# Patient Record
Sex: Female | Born: 1995 | Race: Black or African American | Hispanic: No | Marital: Single | Smoking: Current some day smoker
Health system: Southern US, Community
[De-identification: ages and names within clinical notes are randomized; demographics above are authoritative.]

## PROBLEM LIST (undated history)

## (undated) DIAGNOSIS — J302 Other seasonal allergic rhinitis: Secondary | ICD-10-CM

## (undated) DIAGNOSIS — J45909 Unspecified asthma, uncomplicated: Secondary | ICD-10-CM

## (undated) HISTORY — PX: NO PAST SURGERIES: SHX2092

---
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
## 2017-09-08 ENCOUNTER — Inpatient Hospital Stay (HOSPITAL_COMMUNITY)
Admission: AD | Admit: 2017-09-08 | Discharge: 2017-09-08 | Disposition: A | Discharge status: Home / Self Care | Payer: Medicaid Other | Source: Ambulatory Visit | Attending: Obstetrics & Gynecology | Admitting: Obstetrics & Gynecology

## 2017-09-08 DIAGNOSIS — Z3202 Encounter for pregnancy test, result negative: Secondary | ICD-10-CM | POA: Diagnosis not present

## 2017-09-08 DIAGNOSIS — R109 Unspecified abdominal pain: Secondary | ICD-10-CM | POA: Diagnosis present

## 2017-09-08 DIAGNOSIS — B373 Candidiasis of vulva and vagina: Secondary | ICD-10-CM | POA: Diagnosis not present

## 2017-09-08 DIAGNOSIS — B3731 Acute candidiasis of vulva and vagina: Secondary | ICD-10-CM

## 2017-09-08 DIAGNOSIS — F17290 Nicotine dependence, other tobacco product, uncomplicated: Secondary | ICD-10-CM | POA: Insufficient documentation

## 2017-09-08 LAB — POCT PREGNANCY, URINE: Preg Test, Ur: NEGATIVE

## 2017-09-08 LAB — URINALYSIS, ROUTINE W REFLEX MICROSCOPIC
BILIRUBIN URINE: NEGATIVE
Glucose, UA: NEGATIVE mg/dL
Hgb urine dipstick: NEGATIVE
KETONES UR: NEGATIVE mg/dL
Leukocytes, UA: NEGATIVE
NITRITE: NEGATIVE
Protein, ur: NEGATIVE mg/dL
SPECIFIC GRAVITY, URINE: 1.01 (ref 1.005–1.030)
pH: 6 (ref 5.0–8.0)

## 2017-09-08 LAB — WET PREP, GENITAL
Clue Cells Wet Prep HPF POC: NONE SEEN
Sperm: NONE SEEN
TRICH WET PREP: NONE SEEN
YEAST WET PREP: NONE SEEN

## 2017-09-08 MED ORDER — FLUCONAZOLE 150 MG PO TABS: 150.0000 mg | ORAL_TABLET | Freq: Once | ORAL | 0 refills | Status: AC

## 2017-09-08 NOTE — MAU Provider Note (Signed)
History     CSN: [id number]  Arrival date and time: 09/08/17 2050   First Provider Initiated Contact with Patient 09/08/17 2111      Chief Complaint  Patient presents with  . Abdominal Pain  . Vaginal Discharge   HPI Robin Cobb is a 22 y.o. G0P0000 non pregnant female who presents with burning with urination and a thick white discharge. She states these symptoms have been ongoing for the last 3 weeks and come and go. She reports intermittent abdominal pain as well that she rates a 4/10 and tries tylenol for with relief.   OB History    Gravida  0   Para  0   Term  0   Preterm  0   AB  0   Living  0     SAB  0   TAB  0   Ectopic  0   Multiple  0   Live Births  0           Past Medical History:  Diagnosis Date  . Asthma   . Seasonal allergies     Past Surgical History:  Procedure Laterality Date  . NO PAST SURGERIES      No family history on file.  Social History   Tobacco Use  . Smoking status: Current Some Day Smoker    Types: Cigars  . Smokeless tobacco: Never Used  Substance Use Topics  . Alcohol use: No  . Drug use: Yes    Types: Marijuana    Allergies: No Known Allergies  Medications Prior to Admission  Medication Sig Dispense Refill Last Dose  . albuterol (PROVENTIL HFA;VENTOLIN HFA) 108 (90 BASE) MCG/ACT inhaler Inhale 2 puffs into the lungs every 6 (six) hours as needed for wheezing.   Past Month at Unknown time  . nystatin cream (MYCOSTATIN) Apply 1 application topically 2 (two) times daily. 30 g 0   . phenazopyridine (PYRIDIUM) 100 MG tablet Take 1 tablet (100 mg total) by mouth 3 (three) times daily as needed for pain. 6 tablet 0   . triamcinolone cream (KENALOG) 0.1 % Apply 1 application topically 2 (two) times daily. 30 g 0     Review of Systems  Constitutional: Negative.  Negative for fatigue and fever.  HENT: Negative.   Respiratory: Negative.  Negative for shortness of breath.   Cardiovascular: Negative.   Negative for chest pain.  Gastrointestinal: Positive for abdominal pain. Negative for constipation, diarrhea, nausea and vomiting.  Genitourinary: Positive for dysuria and vaginal discharge.  Neurological: Negative.  Negative for dizziness and headaches.   Physical Exam   Blood pressure 137/80, pulse 76, temperature 97.8 F (36.6 C), temperature source Oral, resp. rate 16, height  (1.549 m), weight 166 lb 1.9 oz (75.4 kg), SpO2 100 %.  Physical Exam  Nursing note and vitals reviewed. Constitutional: She is oriented to person, place, and time. She appears well-developed and well-nourished. No distress.  HENT:  Head: Normocephalic.  Eyes: Pupils are equal, round, and reactive to light.  Cardiovascular: Normal rate, regular rhythm and normal heart sounds.  Respiratory: Effort normal and breath sounds normal. No respiratory distress.  GI: Soft. Bowel sounds are normal. She exhibits no distension. There is no tenderness.  Genitourinary: Vaginal discharge found.  Neurological: She is alert and oriented to person, place, and time.  Skin: Skin is warm and dry.  Psychiatric: She has a normal mood and affect. Her behavior is normal. Judgment and thought content normal.    MAU  Course  Procedures Results for orders placed or performed during the hospital encounter of 09/08/17 (from the past 24 hour(s))  Urinalysis, Routine w reflex microscopic     Status: None   Collection Time: 09/08/17  9:04 PM  Result Value Ref Range   Color, Urine YELLOW YELLOW   APPearance CLEAR CLEAR   Specific Gravity, Urine 1.010 1.005 - 1.030   pH 6.0 5.0 - 8.0   Glucose, UA NEGATIVE NEGATIVE mg/dL   Hgb urine dipstick NEGATIVE NEGATIVE   Bilirubin Urine NEGATIVE NEGATIVE   Ketones, ur NEGATIVE NEGATIVE mg/dL   Protein, ur NEGATIVE NEGATIVE mg/dL   Nitrite NEGATIVE NEGATIVE   Leukocytes, UA NEGATIVE NEGATIVE  Pregnancy, urine POC     Status: None   Collection Time: 09/08/17  9:12 PM  Result Value Ref  Range   Preg Test, Ur NEGATIVE NEGATIVE  Wet prep, genital     Status: Abnormal   Collection Time: 09/08/17  9:23 PM  Result Value Ref Range   Yeast Wet Prep HPF POC NONE SEEN NONE SEEN   Trich, Wet Prep NONE SEEN NONE SEEN   Clue Cells Wet Prep HPF POC NONE SEEN NONE SEEN   WBC, Wet Prep HPF POC FEW (A) NONE SEEN   Sperm NONE SEEN    MDM UA, UPT Wet prep and gc/chlamydia Patient denies any pain or burning with urination while in MAU  Negative wet prep but will treat for yeast due to clinical presentation.  Assessment and Plan   1. Candidiasis of vagina    -Discharge home in stable condition -Rx for diflucan given to patient -Patient advised to follow-up with gyn of choice for routine gyn concerns -Patient may return to MAU as needed or if her condition were to change or worsen  Rolm Bookbinder CNM 09/08/2017, 9:12 PM

## 2017-09-08 NOTE — MAU Note (Signed)
Burning w/urination, thick discharge and lower abdominal pain for 2 weeks. Denies bleeding.

## 2017-09-08 NOTE — Discharge Instructions (Signed)
In late 2019, the [facility] will be moving to the [facility] campus. At that time, the MAU (Maternity Admissions Unit), where you are being seen today, will no longer take care of non-pregnant patients. We strongly encourage you to find a doctor's office before that time, so that you can be seen with any GYN concerns, like vaginal discharge, urinary tract infection, etc.. in a timely manner.  In order to make an office visit more convenient, the Center for [facility] at [facility] will be offering evening hours with same-day appointments, walk-in appointments and scheduled appointments available during this time.  Center for [facility] @ [facility] Hours: Monday - 8am - 7:30 pm with walk-in between 4pm- 7:30 pm Tuesday - 8 am - 5 pm (starting 08/06/17 we will be open late and accepting walk-ins from 4pm - 7:30pm) Wednesday - 8 am - 5 pm (starting 11/06/17 we will be open late and accepting walk-ins from 4pm - 7:30pm) Thursday 8 am - 5 pm (starting 02/06/18 we will be open late and accepting walk-ins from 4pm - 7:30pm) Friday 8 am - 5 pm  For an appointment please call the Center for [facility] @ [facility] at [phone]  For urgent needs, [facility] is also available for management of urgent GYN complaints such as vaginal discharge or urinary tract infections.       Vaginal Yeast infection, Adult Vaginal yeast infection is a condition that causes soreness, swelling, and redness (inflammation) of the vagina. It also causes vaginal discharge. This is a common condition. Some women get this infection frequently. What are the causes? This condition is caused by a change in the normal balance of the yeast (candida) and bacteria that live in the vagina. This change causes an overgrowth of yeast, which causes the inflammation. What increases the risk? This condition is more likely to develop in:  Women who take antibiotic  medicines.  Women who have diabetes.  Women who take birth control pills.  Women who are pregnant.  Women who douche often.  Women who have a weak defense (immune) system.  Women who have been taking steroid medicines for a long time.  Women who frequently wear tight clothing.  What are the signs or symptoms? Symptoms of this condition include:  White, thick vaginal discharge.  Swelling, itching, redness, and irritation of the vagina. The lips of the vagina (vulva) may be affected as well.  Pain or a burning feeling while urinating.  Pain during sex.  How is this diagnosed? This condition is diagnosed with a medical history and physical exam. This will include a pelvic exam. Your health care provider will examine a sample of your vaginal discharge under a microscope. Your health care provider may send this sample for testing to confirm the diagnosis. How is this treated? This condition is treated with medicine. Medicines may be over-the-counter or prescription. You may be told to use one or more of the following:  Medicine that is taken orally.  Medicine that is applied as a cream.  Medicine that is inserted directly into the vagina (suppository).  Follow these instructions at home:  Take or apply over-the-counter and prescription medicines only as told by your health care provider.  Do not have sex until your health care provider has approved. Tell your sex partner that you have a yeast infection. That person should go to his or her health care provider if he or she develops symptoms.  Do not wear tight  clothes, such as pantyhose or tight pants.  Avoid using tampons until your health care provider approves.  Eat more yogurt. This may help to keep your yeast infection from returning.  Try taking a sitz bath to help with discomfort. This is a warm water bath that is taken while you are sitting down. The water should only come up to your hips and should cover your  buttocks. Do this 3-4 times per day or as told by your health care provider.  Do not douche.  Wear breathable, cotton underwear.  If you have diabetes, keep your blood sugar levels under control. Contact a health care provider if:  You have a fever.  Your symptoms go away and then return.  Your symptoms do not get better with treatment.  Your symptoms get worse.  You have new symptoms.  You develop blisters in or around your vagina.  You have blood coming from your vagina and it is not your menstrual period.  You develop pain in your abdomen. This information is not intended to replace advice given to you by your health care provider. Make sure you discuss any questions you have with your health care provider. Document Released: 01/31/2005 Document Revised: 10/05/2015 Document Reviewed: 10/25/2014 Elsevier Interactive Patient Education  2018 [employer]

## 2017-09-09 LAB — GC/CHLAMYDIA PROBE AMP (~~LOC~~) NOT AT ARMC
CHLAMYDIA, DNA PROBE: NEGATIVE
Neisseria Gonorrhea: POSITIVE — AB

## 2017-09-10 ENCOUNTER — Inpatient Hospital Stay (HOSPITAL_COMMUNITY)
Admission: AD | Admit: 2017-09-10 | Discharge: 2017-09-11 | Disposition: A | Discharge status: Home / Self Care | Payer: Medicaid Other | Source: Ambulatory Visit | Attending: Family Medicine | Admitting: Family Medicine

## 2017-09-10 DIAGNOSIS — F17290 Nicotine dependence, other tobacco product, uncomplicated: Secondary | ICD-10-CM | POA: Diagnosis not present

## 2017-09-10 DIAGNOSIS — A549 Gonococcal infection, unspecified: Secondary | ICD-10-CM

## 2017-09-10 MED ORDER — AZITHROMYCIN 250 MG PO TABS
1000.0000 mg | ORAL_TABLET | Freq: Once | ORAL | Status: AC
  Administered 2017-09-10: 1000 mg via ORAL
  Filled 2017-09-10: qty 4

## 2017-09-10 MED ORDER — CEFTRIAXONE SODIUM 250 MG IJ SOLR
250.0000 mg | Freq: Once | INTRAMUSCULAR | Status: AC
  Administered 2017-09-10: 250 mg via INTRAMUSCULAR
  Filled 2017-09-10: qty 250

## 2017-09-10 NOTE — MAU Note (Signed)
PT SAYS SHE SAW HER RESULTS ON MY CHART -  SAYS SHE WAS POSITIVE  FOR GC.     SHE WAS HERE  ON Sunday  - TESTED

## 2017-09-10 NOTE — MAU Provider Note (Signed)
Chief Complaint: Treatment Planning  SUBJECTIVE HPI: Robin Cobb is a 22 y.o. G0P0000 not currently pregnant who presents to maternity admissions for Gonorrhea treatment. She was seen on 5/5 in MAU- reports getting a notification from MyChart of positive results. She reports continued vaginal discharge that was present when seen on 5/5, she denies abdominal pain.   Past Medical History:  Diagnosis Date  . Asthma   . Seasonal allergies    Past Surgical History:  Procedure Laterality Date  . NO PAST SURGERIES     Social History   Socioeconomic History  . Marital status: Single    Spouse name: Not on file  . Number of children: Not on file  . Years of education: Not on file  . Highest education level: Not on file  Occupational History  . Not on file  Social Needs  . Financial resource strain: Not on file  . Food insecurity:    Worry: Not on file    Inability: Not on file  . Transportation needs:    Medical: Not on file    Non-medical: Not on file  Tobacco Use  . Smoking status: Current Some Day Smoker    Types: Cigars  . Smokeless tobacco: Never Used  Substance and Sexual Activity  . Alcohol use: No  . Drug use: Yes    Types: Marijuana  . Sexual activity: Yes    Birth control/protection: Condom  Lifestyle  . Physical activity:    Days per week: Not on file    Minutes per session: Not on file  . Stress: Not on file  Relationships  . Social connections:    Talks on phone: Not on file    Gets together: Not on file    Attends religious service: Not on file    Active member of club or organization: Not on file    Attends meetings of clubs or organizations: Not on file    Relationship status: Not on file  . Intimate partner violence:    Fear of current or ex partner: Not on file    Emotionally abused: Not on file    Physically abused: Not on file    Forced sexual activity: Not on file  Other Topics Concern  . Not on file  Social History Narrative   ** Merged  History Encounter **       No current facility-administered medications on file prior to encounter.    Current Outpatient Medications on File Prior to Encounter  Medication Sig Dispense Refill  . albuterol (PROVENTIL HFA;VENTOLIN HFA) 108 (90 BASE) MCG/ACT inhaler Inhale 2 puffs into the lungs every 6 (six) hours as needed for wheezing.     No Known Allergies  ROS:  Review of Systems  Respiratory: Negative.   Cardiovascular: Negative.   Gastrointestinal: Positive for abdominal pain.       No abdominal pain currently   Genitourinary: Positive for vaginal discharge.   I have reviewed patient's Past Medical Hx, Surgical Hx, Family Hx, Social Hx, medications and allergies.   Physical Exam   Patient Vitals for the past 24 hrs:  BP Temp Temp src Pulse Resp  09/10/17 2325 131/74 98 F (36.7 C) Oral 67 20   Constitutional: Well-developed, well-nourished female in no acute distress.  Cardiovascular: normal rate Respiratory: normal effort  LAB RESULTS Positive Gonorrhea on 09/08/2017, negative chlamydia.      MAU Management/MDM:  Meds ordered this encounter  Medications  . AND Linked Order Group   . cefTRIAXone (ROCEPHIN)  injection 250 mg     Order Specific Question:   Antibiotic Indication:     Answer:   STD   . azithromycin (ZITHROMAX) tablet 1,000 mg   Expedited partner therapy prescription unable to be given to patient- patient reports not knowing partner last name and DOB. Discussed importance of notifying partner of positive result and getting treatment at health department ASAP.   Pt discharged with strict STD precautions.  ASSESSMENT 1. Gonorrhea     PLAN Discharge home Follow up with Health department or PCP with concern about other STDs and treatment  Partner information sheet given to patient with need to present to Health Department for treatment   Follow-up Information    Department, [facility] Follow up.   Contact information: [address] [place] [address] [phone]           Allergies as of 09/10/2017   No Known Allergies     Medication List    TAKE these medications   albuterol 108 (90 Base) MCG/ACT inhaler Commonly known as:  PROVENTIL HFA;VENTOLIN HFA Inhale 2 puffs into the lungs every 6 (six) hours as needed for wheezing.      Steward Drone  Certified Nurse-Midwife 09/10/2017  11:59 PM

## 2017-09-10 NOTE — Discharge Instructions (Signed)
Expedited Partner Therapy:  °Information Sheet for Patients and Partners  °            ° °You have been offered expedited partner therapy (EPT). This information sheet contains important information and warnings you need to be aware of, so please read it carefully.  ° °Expedited Partner Therapy (EPT) is the clinical practice of treating the sexual partners of persons who receive chlamydia, gonorrhea, or trichomoniasis diagnoses by providing medications or prescriptions to the patient. Patients then provide partners with these therapies without the health-care provider having examined the partner. In other words, EPT is a convenient, fast and private way for patients to help their sexual partners get treated.  ° °Chlamydia and gonorrhea are bacterial infections you get from having sex with a person who is already infected. Trichomoniasis (or “trich”) is a very common sexually transmitted infection (STI) that is caused by infection with a protozoan parasite called Trichomonas vaginalis.  Many people with these infections don’t know it because they feel fine, but without treatment these infections can cause serious health problems, such as pelvic inflammatory disease, ectopic pregnancy, infertility and increased risk of HIV.  ° °It is important to get treated as soon as possible to protect your health, to avoid spreading these infections to others, and to prevent yourself from becoming re-infected. The good news is these infections can be easily cured with proper antibiotic medicine. The best way to take care of your self is to see a doctor or go to your local health department. If you are not able to see a doctor or other medical provider, you should take EPT.  ° ° °Recommended Medication: °EPT for Chlamydia:  Azithromycin (Zithromax) 1 gram orally in a single dose °EPT for Gonorrhea:  Cefixime (Suprax) 400 milligrams orally in a single dose PLUS azithromycin (Zithromax) 1 gram orally in a single dose °EPT for  Trichomoniasis:  Metronidazole (Flagyl) 2 grams orally in a single dose ° ° °These medicines are very safe. However, you should not take them if you have ever had an allergic reaction (like a rash) to any of these medicines: azithromycin (Zithromax), erythromycin, clarithromycin (Biaxin), metronidazole (Flagyl), tinidazole (Tindimax). If you are uncertain about whether you have an allergy, call your medical provider or pharmacist before taking this medicine. If you have a serious, long-term illness like kidney, liver or heart disease, colitis or stomach problems, or you are currently taking other prescription medication, talk to your provider before taking this medication.  ° °Women: If you have lower belly pain, pain during sex, vomiting, or a fever, do not take this medicine. Instead, you should see a medical provider to be certain you do not have pelvic inflammatory disease (PID). PID can be serious and lead to infertility, pregnancy problems or chronic pelvic pain.  ° °Pregnant Women: It is very important for you to see a doctor to get pregnancy services and pre-natal care. These antibiotics for EPT are safe for pregnant women, but you still need to see a medical provider as soon as possible. It is also important to note that Doxycycline is an alternative therapy for chlamydia, but it should not be taken by someone who is pregnant.  ° °Men: If you have pain or swelling in the testicles or a fever, do not take this medicine and see a medical provider.    ° °Men who have sex with men (MSM): MSM in [place] continue to experience high rates of syphilis and HIV. Many MSM with gonorrhea or   chlamydia could also have syphilis and/or HIV and not know it. If you are a man who has sex with other men, it is very important that you see a medical provider and are tested for HIV and syphilis. EPT is not recommended for gonorrhea for MSM.  Recommended treatment for gonorrhea for MSM is Rocephin (shot) AND azithromycin  due to decreased cure rate.  Please see your medical provider if this is the case.   ° °Along with this information sheet is a prescription for the medicine. If you receive a prescription it will be in your name and will indicate your date of birth, or it will be in the name of “Expedited Partner Therapy”.   In either case, you can have the prescription filled at a pharmacy. You will be responsible for the cost of the medicine, unless you have prescription drug coverage. In that case, you could provide your name so the pharmacy could bill your health plan.  ° °Take the medication as directed. Some people will have a mild, upset stomach, which does not last long. AVOID alcohol 24 hours after taking metronidazole (Flagyl) to reduce the possibility of a disulfiram-like reaction (severe vomiting and abdominal pain).  After taking the medicine, do not have sex for 7 days. Do not share this medicine or give it to anyone else. It is important to tell everyone you have had sex with in the last 60 days that they need to go and get tested for sexually transmitted infections.  ° °Ways to prevent these and other sexually transmitted infections (STIs):  ° °• Abstain from sex. This is the only sure way to avoid getting an STI.  °• Use barrier methods, such as condoms, consistently and correctly.  °• Limit the number of sexual partners.  °• Have regular physical exams, including testing for STIs.  ° °For more information about EPT or other issues pertaining to an STI, please contact your medical provider or the [place] [facility] at [phone] or [email or web address]   ° °

## 2019-07-19 ENCOUNTER — Other Ambulatory Visit: Payer: Self-pay

## 2019-07-19 ENCOUNTER — Emergency Department (HOSPITAL_BASED_OUTPATIENT_CLINIC_OR_DEPARTMENT_OTHER): Payer: Medicaid Other

## 2019-07-19 ENCOUNTER — Emergency Department (HOSPITAL_COMMUNITY)
Admission: EM | Admit: 2019-07-19 | Discharge: 2019-07-19 | Disposition: A | Discharge status: Home / Self Care | Payer: Medicaid Other | Attending: Emergency Medicine | Admitting: Emergency Medicine

## 2019-07-19 ENCOUNTER — Encounter (HOSPITAL_COMMUNITY): Payer: Self-pay | Admitting: Emergency Medicine

## 2019-07-19 DIAGNOSIS — J45909 Unspecified asthma, uncomplicated: Secondary | ICD-10-CM | POA: Insufficient documentation

## 2019-07-19 DIAGNOSIS — M25472 Effusion, left ankle: Secondary | ICD-10-CM | POA: Diagnosis not present

## 2019-07-19 DIAGNOSIS — F17210 Nicotine dependence, cigarettes, uncomplicated: Secondary | ICD-10-CM | POA: Insufficient documentation

## 2019-07-19 DIAGNOSIS — L309 Dermatitis, unspecified: Secondary | ICD-10-CM | POA: Diagnosis not present

## 2019-07-19 DIAGNOSIS — Z79899 Other long term (current) drug therapy: Secondary | ICD-10-CM | POA: Diagnosis not present

## 2019-07-19 DIAGNOSIS — I10 Essential (primary) hypertension: Secondary | ICD-10-CM | POA: Diagnosis not present

## 2019-07-19 DIAGNOSIS — M7989 Other specified soft tissue disorders: Secondary | ICD-10-CM | POA: Diagnosis not present

## 2019-07-19 DIAGNOSIS — R519 Headache, unspecified: Secondary | ICD-10-CM | POA: Diagnosis not present

## 2019-07-19 LAB — CBC WITH DIFFERENTIAL/PLATELET
Abs Immature Granulocytes: 0.02 10*3/uL (ref 0.00–0.07)
Basophils Absolute: 0 10*3/uL (ref 0.0–0.1)
Basophils Relative: 0 %
Eosinophils Absolute: 0.1 10*3/uL (ref 0.0–0.5)
Eosinophils Relative: 1 %
HCT: 37.6 % (ref 36.0–46.0)
Hemoglobin: 12.4 g/dL (ref 12.0–15.0)
Immature Granulocytes: 0 %
Lymphocytes Relative: 44 %
Lymphs Abs: 2.9 10*3/uL (ref 0.7–4.0)
MCH: 32.2 pg (ref 26.0–34.0)
MCHC: 33 g/dL (ref 30.0–36.0)
MCV: 97.7 fL (ref 80.0–100.0)
Monocytes Absolute: 0.4 10*3/uL (ref 0.1–1.0)
Monocytes Relative: 6 %
Neutro Abs: 3.2 10*3/uL (ref 1.7–7.7)
Neutrophils Relative %: 49 %
Platelets: 295 10*3/uL (ref 150–400)
RBC: 3.85 MIL/uL — ABNORMAL LOW (ref 3.87–5.11)
RDW: 12 % (ref 11.5–15.5)
WBC: 6.6 10*3/uL (ref 4.0–10.5)
nRBC: 0 % (ref 0.0–0.2)

## 2019-07-19 LAB — URINALYSIS, ROUTINE W REFLEX MICROSCOPIC
Bilirubin Urine: NEGATIVE
Glucose, UA: NEGATIVE mg/dL
Hgb urine dipstick: NEGATIVE
Ketones, ur: NEGATIVE mg/dL
Nitrite: NEGATIVE
Protein, ur: NEGATIVE mg/dL
Specific Gravity, Urine: 1.016 (ref 1.005–1.030)
pH: 7 (ref 5.0–8.0)

## 2019-07-19 LAB — PREGNANCY, URINE: Preg Test, Ur: NEGATIVE

## 2019-07-19 LAB — BASIC METABOLIC PANEL
Anion gap: 8 (ref 5–15)
BUN: 10 mg/dL (ref 6–20)
CO2: 24 mmol/L (ref 22–32)
Calcium: 9.2 mg/dL (ref 8.9–10.3)
Chloride: 106 mmol/L (ref 98–111)
Creatinine, Ser: 0.72 mg/dL (ref 0.44–1.00)
GFR calc Af Amer: >60 mL/min (ref >60)
GFR calc non Af Amer: >60 mL/min (ref >60)
Glucose, Bld: 94 mg/dL (ref 70–99)
Potassium: 3.9 mmol/L (ref 3.5–5.1)
Sodium: 138 mmol/L (ref 135–145)

## 2019-07-19 MED ORDER — KETOROLAC TROMETHAMINE 30 MG/ML IJ SOLN
30.0000 mg | Freq: Once | INTRAMUSCULAR | Status: AC
  Administered 2019-07-19: 17:00:00 30 mg via INTRAMUSCULAR
  Filled 2019-07-19: qty 1

## 2019-07-19 NOTE — ED Provider Notes (Signed)
[facility] EMERGENCY DEPARTMENT Provider Note   CSN: [id number] Arrival date & time: 07/19/19  1417     History Chief Complaint  Patient presents with  . Headache  . Rash    Robin Cobb is a 24 y.o. female with PMHx asthma and seasonal allergies who presents to the ED today complaining of gradual onset, constant, gradually improving, right sided headache that began yesterday. Pt also complains of nausea associated with the headache. She reports hx of similar headaches intermittently for the past couple of months. Pt does not think this headache is any worse than previous. She took 1 tablet of Tylenol Extra Strength this AM with mild relief. Currently rates the headache as a 3/10. Pt denies fevers, chills, vomiting, blurry vision, double vision, confusion, speech difficulties, AMS, unilateral weakness or numbness, or any other associated symptoms.   She also reports pruritic rash intermittently for the past 2 months. Pt reports hx of eczema and thinks this may be the cause. She has been applying hydrocortisone cream with mild relief.   Pt also complains of left ankle swelling that she noticed yesterday. She does report that she was on a 6 hour car ride 2 days ago with only 1 stop. No hx DVT/PE. Pt not on OCPs. Denies chest pain or SOB.   The history is provided by the patient.       Past Medical History:  Diagnosis Date  . Asthma   . Seasonal allergies     There are no problems to display for this patient.   Past Surgical History:  Procedure Laterality Date  . NO PAST SURGERIES       OB History    Gravida  0   Para  0   Term  0   Preterm  0   AB  0   Living  0     SAB  0   TAB  0   Ectopic  0   Multiple  0   Live Births  0           No family history on file.  Social History   Tobacco Use  . Smoking status: Current Some Day Smoker    Types: Cigars  . Smokeless tobacco: Never Used  Substance Use Topics  . Alcohol use:  No  . Drug use: Yes    Types: Marijuana    Home Medications Prior to Admission medications   Medication Sig Start Date End Date Taking? Authorizing Provider  albuterol (PROVENTIL HFA;VENTOLIN HFA) 108 (90 BASE) MCG/ACT inhaler Inhale 2 puffs into the lungs every 6 (six) hours as needed for wheezing.    [provider]    Allergies    Patient has no known allergies.  Review of Systems   Review of Systems  Constitutional: Negative for chills and fever.  Eyes: Negative for photophobia and visual disturbance.  Respiratory: Negative for shortness of breath.   Cardiovascular: Positive for leg swelling. Negative for chest pain.  Gastrointestinal: Positive for nausea. Negative for abdominal pain, diarrhea and vomiting.  Musculoskeletal: Negative for neck pain and neck stiffness.  Skin: Positive for rash.  Neurological: Positive for headaches.  All other systems reviewed and are negative.   Physical Exam Updated Vital Signs BP (!) 153/89 (BP Location: Left Arm)   Pulse 77   Temp 98.4 F (36.9 C) (Oral)   Resp 16   LMP 06/21/2019   SpO2 100%   Physical Exam Vitals and nursing note reviewed.  Constitutional:  Appearance: She is obese. She is not ill-appearing or diaphoretic.  HENT:     Head: Normocephalic and atraumatic.  Eyes:     Extraocular Movements: Extraocular movements intact.     Conjunctiva/sclera: Conjunctivae normal.     Pupils: Pupils are equal, round, and reactive to light.  Neck:     Meningeal: Brudzinski's sign and Kernig's sign absent.  Cardiovascular:     Rate and Rhythm: Normal rate and regular rhythm.     Heart sounds: Normal heart sounds.  Pulmonary:     Effort: Pulmonary effort is normal.     Breath sounds: Normal breath sounds. No wheezing, rhonchi or rales.  Abdominal:     Palpations: Abdomen is soft.     Tenderness: There is no abdominal tenderness. There is no guarding.  Musculoskeletal:     Cervical back: Neck supple.      Comments: Mild swelling of L ankle compared to R. No TTP to calf. ROM intact with dorsiflexion and plantarflexion. Strength and sensation intact. 2+ DP pulse.   Skin:    General: Skin is warm and dry.     Comments: Dry skin and excoriations noted to BLEs; no obvious rash   Neurological:     Mental Status: She is alert.     Comments: CN 3-12 grossly intact A&O x4 GCS 15 Sensation and strength intact Gait nonataxic including with tandem walking Coordination with finger-to-nose WNL Neg romberg, neg pronator drift     ED Results / Procedures / Treatments   Labs (all labs ordered are listed, but only abnormal results are displayed) Labs Reviewed  CBC WITH DIFFERENTIAL/PLATELET - Abnormal; Notable for the following components:      Result Value   RBC 3.85 (*)    All other components within normal limits  URINALYSIS, ROUTINE W REFLEX MICROSCOPIC - Abnormal; Notable for the following components:   APPearance CLOUDY (*)    Leukocytes,Ua SMALL (*)    Bacteria, UA RARE (*)    All other components within normal limits  BASIC METABOLIC PANEL  PREGNANCY, URINE    EKG None  Radiology VAS [place] LOWER EXTREMITY VENOUS (DVT) ([facility] and [facility] 7a-7p)  Result Date: 07/19/2019  Lower Venous DVTStudy Indications: Rash.  Comparison Study: no prior Performing Technologist: Blanch Media RVS  Examination Guidelines: A complete evaluation includes B-mode imaging, spectral Doppler, color Doppler, and power Doppler as needed of all accessible portions of each vessel. Bilateral testing is considered an integral part of a complete examination. Limited examinations for reoccurring indications may be performed as noted. The reflux portion of the exam is performed with the patient in reverse Trendelenburg.  +-----+---------------+---------+-----------+----------+--------------+ RIGHTCompressibilityPhasicitySpontaneityPropertiesThrombus Aging +-----+---------------+---------+-----------+----------+--------------+  CFV  Full           Yes      Yes                                 +-----+---------------+---------+-----------+----------+--------------+   +---------+---------------+---------+-----------+----------+--------------+ LEFT     CompressibilityPhasicitySpontaneityPropertiesThrombus Aging +---------+---------------+---------+-----------+----------+--------------+ CFV      Full           Yes      Yes                                 +---------+---------------+---------+-----------+----------+--------------+ SFJ      Full                                                        +---------+---------------+---------+-----------+----------+--------------+  FV Prox  Full                                                        +---------+---------------+---------+-----------+----------+--------------+ FV Mid   Full                                                        +---------+---------------+---------+-----------+----------+--------------+ FV DistalFull                                                        +---------+---------------+---------+-----------+----------+--------------+ PFV      Full                                                        +---------+---------------+---------+-----------+----------+--------------+ POP      Full           Yes      Yes                                 +---------+---------------+---------+-----------+----------+--------------+ PTV      Full                                                        +---------+---------------+---------+-----------+----------+--------------+ PERO     Full                                                        +---------+---------------+---------+-----------+----------+--------------+     Summary: RIGHT: - No evidence of common femoral vein obstruction.  LEFT: - There is no evidence of deep vein thrombosis in the lower extremity.  - No cystic structure found in the popliteal fossa.  *See  table(s) above for measurements and observations. Electronically signed by Harold Barban MD on 07/19/2019 at 4:17:58 PM.    Final     Procedures Procedures (including critical care time)  Medications Ordered in ED Medications  ketorolac (TORADOL) 30 MG/ML injection 30 mg (30 mg Intramuscular Given 07/19/19 1636)    ED Course  I have reviewed the triage vital signs and the nursing notes.  Pertinent labs & imaging results that were available during my care of the patient were reviewed by me and considered in my medical decision making (see chart for details).  24 year old female who presents to the ED today with multiple complaints.  Currently complaining of a headache that started yesterday, history of same.  Has associated nausea.  Does complain of a rash to her  bilateral lower extremities however reports history of eczema and states that it is an itchy rash.  There is no signs of purpura today.  Patient is afebrile, nontachycardic and nontachypneic.  She has no focal neuro deficits or meningeal signs today.  States that her headache is about a 3 out of 10 and gradually improving.  Is noted to have high blood pressure today is 153/89.  Given this will obtain screening labs and await creatinine level prior to Toradol injection.  Do not feel patient needs imaging of her head today or LP as I doubt acute intracranial abnormality. There is no obvious rash on her legs; she does have dry skin and excoriations. Feel pt can follow up with PCP on this.   Pt is also complaining of L ankle swelling with recent prolonged travel; will obtain DVT study. No complaint of chest pain or SOB today.   CBC without leukocytosis. Hgb stable.  BMP without electrolyte abnormalities. Creatinine within normal limits at 0.72.  U/A without infection. Urine preg negative.  DVT study negative.   Toradol ordered for patient's headache. Remainder of labwork reassuring; feel pt can follow up with PCP for further eval of  chronic complaints. Strict return precautions discussed. Pt is in agreement with plan and stable for discharge home.   This note was prepared using Dragon voice recognition software and may include unintentional dictation errors due to the inherent limitations of voice recognition software.    MDM Rules/Calculators/A&P                       Final Clinical Impression(s) / ED Diagnoses Final diagnoses:  Acute nonintractable headache, unspecified headache type  Eczema, unspecified type  Hypertension, unspecified type  Left ankle swelling    Rx / DC Orders ED Discharge Orders    None       Discharge Instructions     Your labwork was reassuring. There were no signs of blood clot on the ultrasound report. Please follow up with your PCP for further evaluation of your headaches, eczema, and high blood pressure. If you do not have a PCP you can follow up with [facility] for primary care needs.        Tanda Rockers, PA-C 07/19/19 1640    Gwyneth Sprout, MD 07/20/19 2053

## 2019-07-19 NOTE — Progress Notes (Signed)
Lower extremity venous has been completed.   Preliminary results in CV Proc.   Blanch Media 07/19/2019 4:10 PM

## 2019-07-19 NOTE — Discharge Instructions (Addendum)
Your labwork was reassuring. There were no signs of blood clot on the ultrasound report. Please follow up with your PCP for further evaluation of your headaches, eczema, and high blood pressure. If you do not have a PCP you can follow up with [facility] for primary care needs.

## 2019-07-19 NOTE — ED Triage Notes (Signed)
C/o R sided headache since yesterday.  Also c/o rash to bilateral lower legs x 2 months.

## 2019-12-30 ENCOUNTER — Emergency Department (HOSPITAL_COMMUNITY)
Admission: EM | Admit: 2019-12-30 | Discharge: 2019-12-30 | Disposition: A | Discharge status: Home / Self Care | Payer: Medicaid Other | Attending: Emergency Medicine | Admitting: Emergency Medicine

## 2019-12-30 ENCOUNTER — Encounter (HOSPITAL_COMMUNITY): Payer: Self-pay | Admitting: Emergency Medicine

## 2019-12-30 DIAGNOSIS — F17210 Nicotine dependence, cigarettes, uncomplicated: Secondary | ICD-10-CM | POA: Diagnosis not present

## 2019-12-30 DIAGNOSIS — J45909 Unspecified asthma, uncomplicated: Secondary | ICD-10-CM | POA: Diagnosis not present

## 2019-12-30 DIAGNOSIS — K0889 Other specified disorders of teeth and supporting structures: Secondary | ICD-10-CM | POA: Insufficient documentation

## 2019-12-30 DIAGNOSIS — Z79899 Other long term (current) drug therapy: Secondary | ICD-10-CM | POA: Diagnosis not present

## 2019-12-30 MED ORDER — CLINDAMYCIN HCL 150 MG PO CAPS: 450.0000 mg | ORAL_CAPSULE | Freq: Three times a day (TID) | ORAL | 0 refills | Status: AC

## 2019-12-30 NOTE — Discharge Instructions (Addendum)
  You were seen in the ER for dental pain.  Pain is likely from broken tooth/exposed nerve endings from cavities.  There are no signs of superimposed infection or collection of pus to require drainage but you are at high risk for abscess and infection.   Take antibiotic as prescribed. This should help with an early infection and the pain.   Dental pain is difficult to control because most of the pain is from exposed nerve endings.  Take 1000 mg of acetaminophen (Tylenol) every 6 hours.  For more pain control take ibuprofen 600 mg every 6 hours.  You can combine acetaminophen and ibuprofen as above every 6 hours for maximum pain and inflammation control.  Use pea sized amount of Orajel 15 to 20 minutes around area of pain and exposed nerves prior to eating or drinking to desensitize nerve endings.  You can apply a pea sized amount of desensitizing toothpastes (Sensodyne, Pronamel, [employer] sensitive) throughout the day to help with nerve pain and sensitivity.  Warm water and salt soaks can help with inflammation and drainage formation.  Unfortunately, dental pain will continue until you have a full dental evaluation and treatment.  Go to your dentist appointment on September 8  Return to the ER for fevers, facial or gum line swelling, redness, pus.

## 2019-12-30 NOTE — ED Provider Notes (Signed)
[facility] EMERGENCY DEPARTMENT Provider Note   CSN: [id number] Arrival date & time: 12/30/19  1220     History Chief Complaint  Patient presents with  . Dental Pain    Robin Cobb is a 24 y.o. female presents to the ED for evaluation of dental pain.  Located in the left upper and lower gumline, posteriorly.  States she has had issues in these areas before approximately 1 year ago.  She went to a dentist but they told her everything was fine.  The pain returned on Thursday.  The pain is throbbing in nature, intermittent.  She has temperature sensitivity and pain with chewing as well.  Currently she feels like the pain is minimal.  Actually went to the dentist this morning but she did have her Medicaid card so they were not able to treat her.  She has a follow-up appointment September 8 but feels like she should not wait any longer.  Has noticed some metallic taste from that area but unsure about bleeding or purulent drainage.  Denies fevers, pain in the throat  HPI     Past Medical History:  Diagnosis Date  . Asthma   . Seasonal allergies     There are no problems to display for this patient.   Past Surgical History:  Procedure Laterality Date  . NO PAST SURGERIES       OB History    Gravida  0   Para  0   Term  0   Preterm  0   AB  0   Living  0     SAB  0   TAB  0   Ectopic  0   Multiple  0   Live Births  0           No family history on file.  Social History   Tobacco Use  . Smoking status: Current Some Day Smoker    Types: Cigars  . Smokeless tobacco: Never Used  Substance Use Topics  . Alcohol use: No  . Drug use: Yes    Types: Marijuana    Home Medications Prior to Admission medications   Medication Sig Start Date End Date Taking? Authorizing Provider  albuterol (PROVENTIL HFA;VENTOLIN HFA) 108 (90 BASE) MCG/ACT inhaler Inhale 2 puffs into the lungs every 6 (six) hours as needed for wheezing.    [provider]  clindamycin (CLEOCIN) 150 MG capsule Take 3 capsules (450 mg total) by mouth 3 (three) times daily for 10 days. 12/30/19 01/09/20  Liberty Handy, PA-C    Allergies    Patient has no known allergies.  Review of Systems   Review of Systems  HENT: Positive for dental problem.   All other systems reviewed and are negative.   Physical Exam Updated Vital Signs BP 97/80   Pulse 87   Temp 98.1 F (36.7 C)   Resp 15   SpO2 95%   Physical Exam Constitutional:      Appearance: She is well-developed.  HENT:     Head: Normocephalic.     Nose: Nose normal.     Mouth/Throat:     Comments: Left upper and lower gumline examined, mild gumline erythema but no reproducible tenderness, edema, fluctuance drainage or visualized abscess on the gumline, tongue or buccal mucosa.  No obvious dental fractures.  Dentition stable.  No intraoral or tongue lesions.  Oropharynx and tonsil exam normal.  Normal sublingual space.  No trismus.  No obvious facial  or neck edema. Eyes:     General: Lids are normal.  Cardiovascular:     Rate and Rhythm: Normal rate.  Pulmonary:     Effort: Pulmonary effort is normal. No respiratory distress.  Musculoskeletal:        General: Normal range of motion.     Cervical back: Normal range of motion.  Neurological:     Mental Status: She is alert.  Psychiatric:        Behavior: Behavior normal.     ED Results / Procedures / Treatments   Labs (all labs ordered are listed, but only abnormal results are displayed) Labs Reviewed - No data to display  EKG None  Radiology No results found.  Procedures Procedures (including critical care time)  Medications Ordered in ED Medications - No data to display  ED Course  I have reviewed the triage vital signs and the nursing notes.  Pertinent labs & imaging results that were available during my care of the patient were reviewed by me and considered in my medical decision making (see chart for  details).    MDM Rules/Calculators/A&P                          Highest on ddx is dentin/nerve exposure from cracked teeth causing nerve irritation.  No signs of dental abscess on exam.  Afebrile, non toxic appearing, swallowing secretions well without hot potato voice, drooling, trismus, asymmetric facial or mandibular or maxillary edema. Clinically this is not consistent with Ludwig's angina, serious dental or other deep tissue infection in neck, jaw, face, throat.  Will treat with antibiotic, NSAIDs, warm salt soaks.  Encouraged patient to follow-up with dentist.  Patient given dentist contact info, encouraged to follow up for ultimate management of dental pain and overall dental health. Strict ED return precautions given. Pt is aware of red flag symptoms that would warrant return to ED for re-evaluation and further treatment. Patient voices understanding and is agreeable to plan.    Final Clinical Impression(s) / ED Diagnoses Final diagnoses:  Pain, dental    Rx / DC Orders ED Discharge Orders         Ordered    clindamycin (CLEOCIN) 150 MG capsule  3 times daily        12/30/19 1348           Jerrell Mylar 12/30/19 1505    Eber Hong, MD 01/01/20 1616

## 2019-12-30 NOTE — ED Triage Notes (Signed)
Pt reports upper and lower left dental pain since Thursday. Also endorses some swelling to LL jaw area.
